# Patient Record
Sex: Male | Born: 1980 | Race: White | Hispanic: No | Marital: Single | Smoking: Current every day smoker
Health system: Southern US, Community
[De-identification: ages and names within clinical notes are randomized; demographics above are authoritative.]

---

## 2000-06-12 ENCOUNTER — Emergency Department (HOSPITAL_COMMUNITY): Admission: EM | Admit: 2000-06-12 | Discharge: 2000-06-12 | Payer: Self-pay | Admitting: Emergency Medicine

## 2000-06-12 ENCOUNTER — Encounter: Payer: Self-pay | Admitting: Emergency Medicine

## 2007-06-07 ENCOUNTER — Emergency Department: Payer: Self-pay | Admitting: Internal Medicine

## 2008-06-04 IMAGING — CR RIGHT FOOT COMPLETE - 3+ VIEW
1 series · 3 of 3 positions shown · non-contrast
Comparison: none

REASON FOR EXAM: Rule out fracture
COMMENTS:

PROCEDURE:     DXR - DXR FOOT RT COMPLETE W/OBLIQUES  - June 07, 2007  [DATE]
RESULT:     There does not appear to be evidence of fracture, dislocation or
malalignment.
Incidental note is made of an os trigonum.

[Series 1: view not recorded · 0.17mm/px · 3 of 3 slices shown]
[im 1/3]
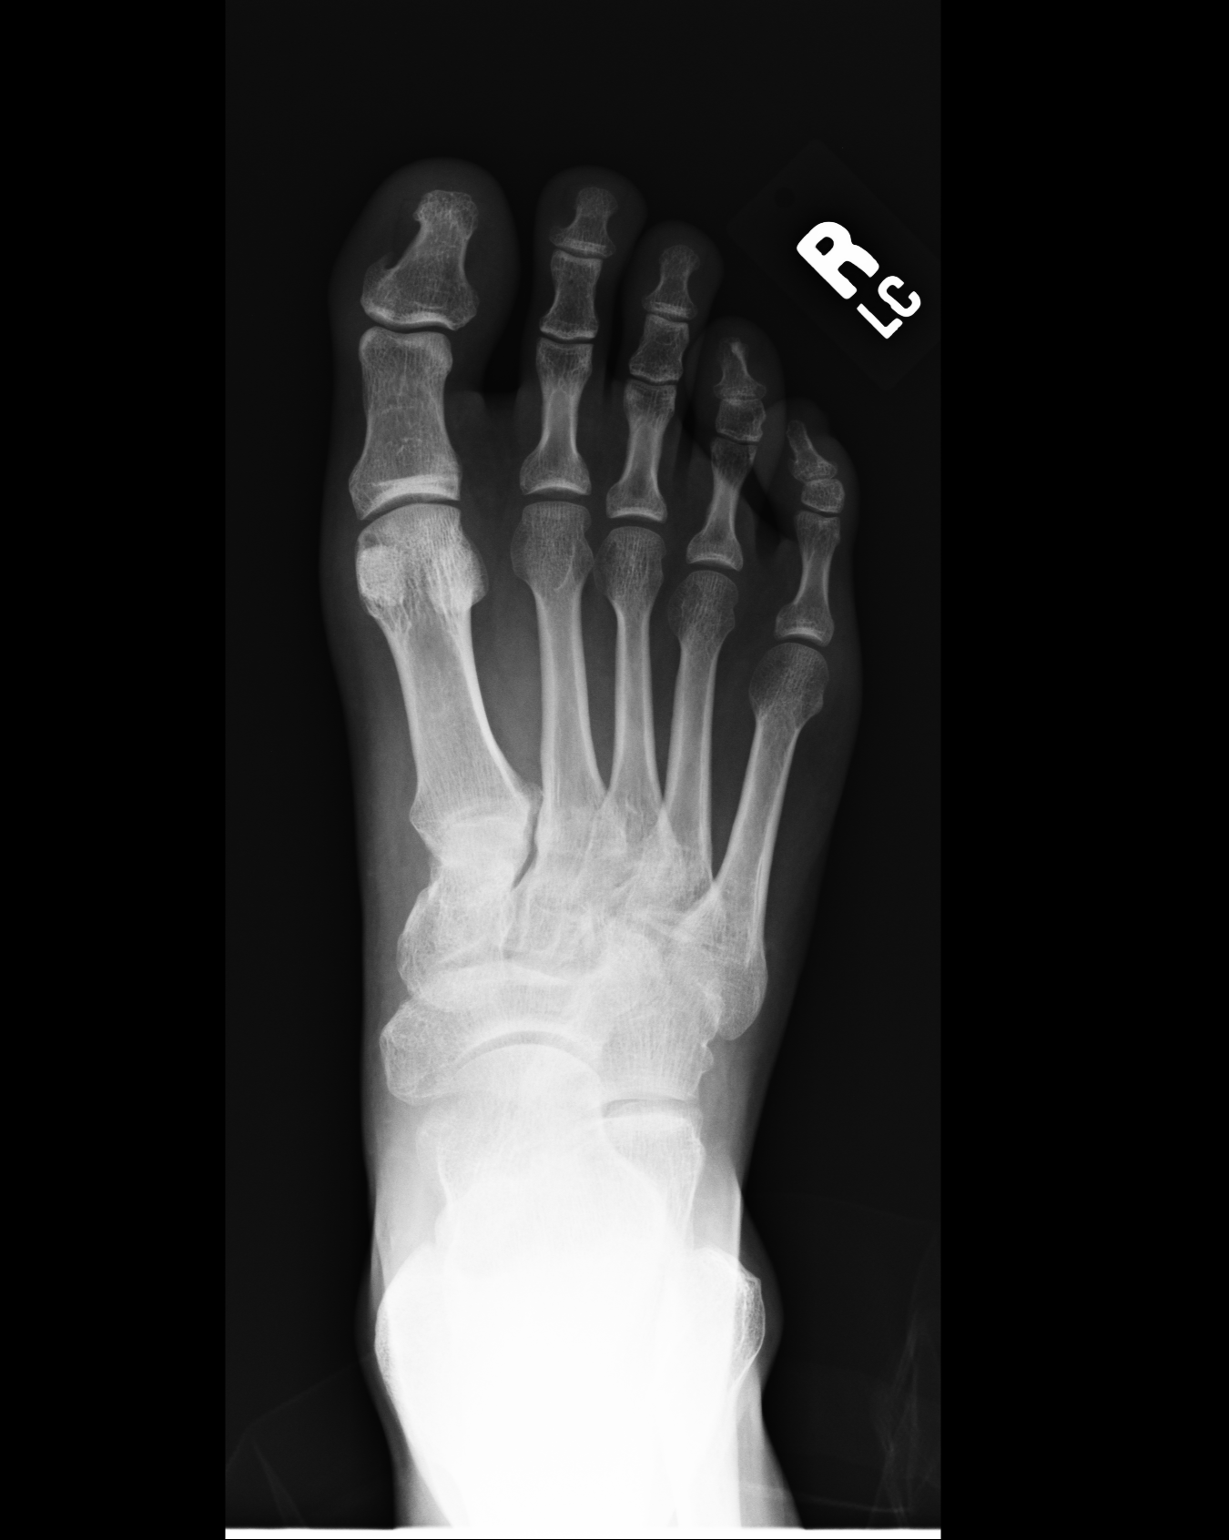
[im 2/3]
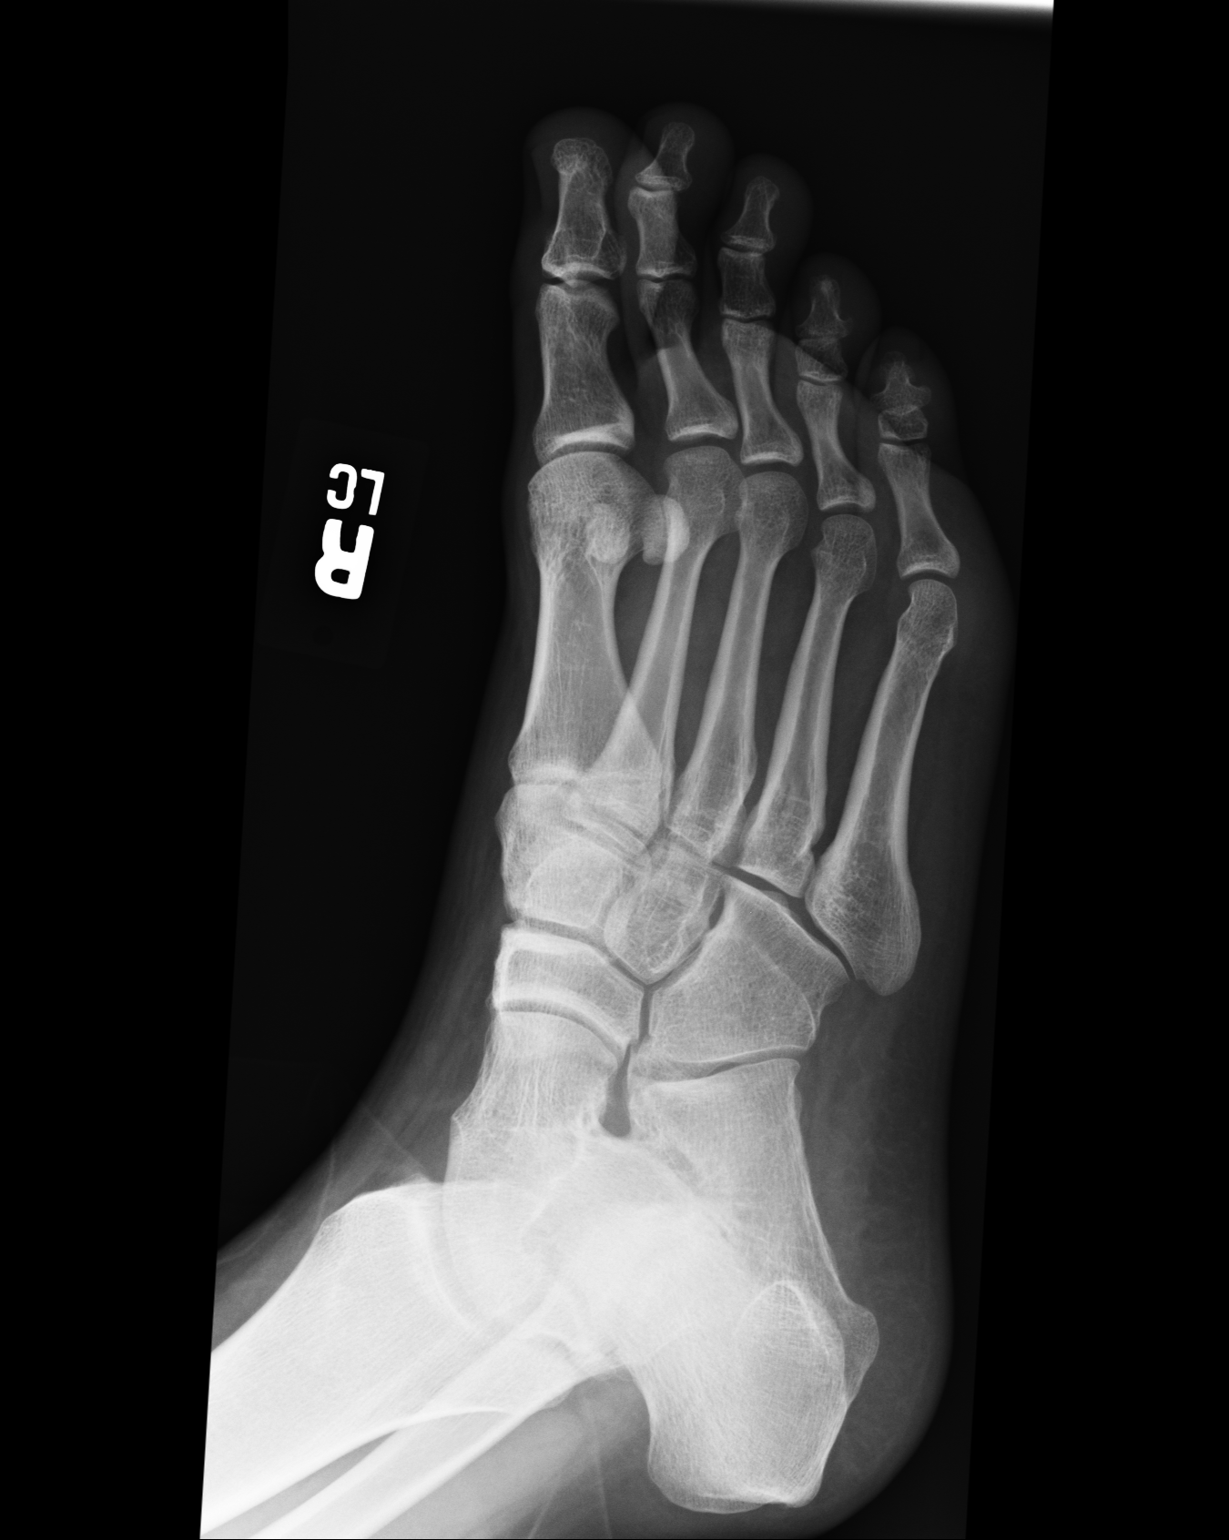
[im 3/3]
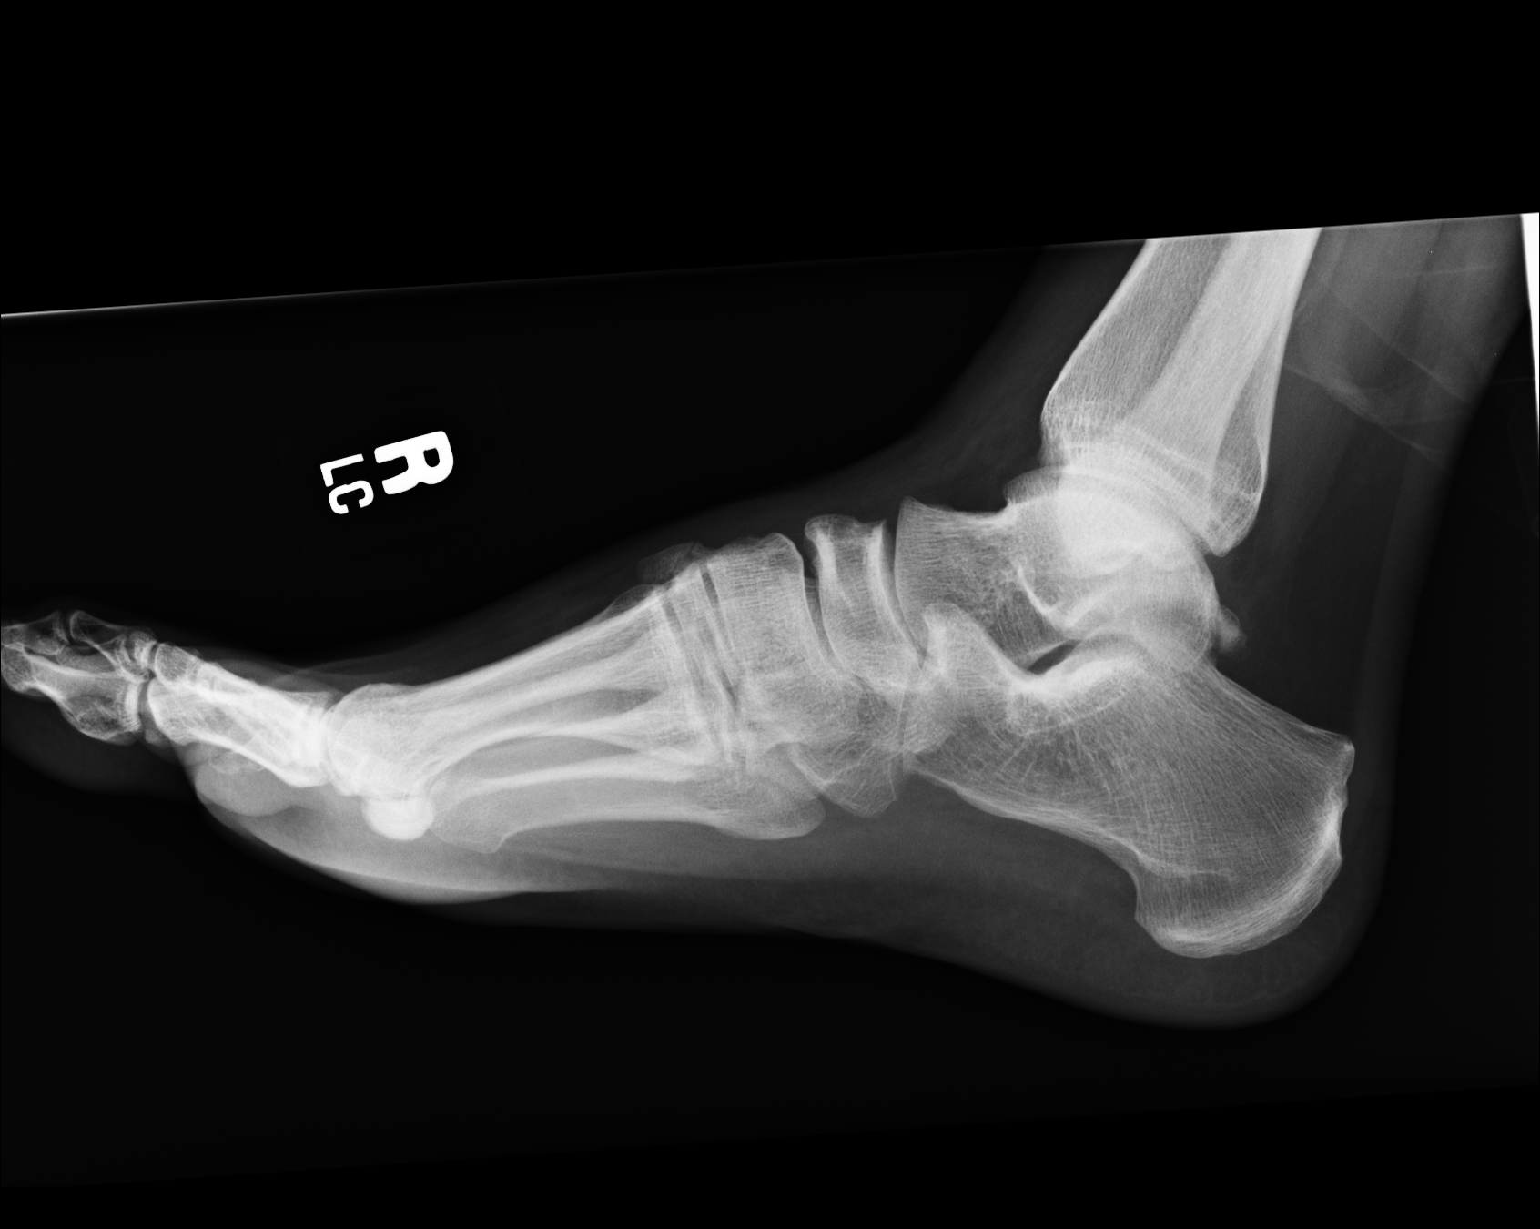

[3 of 3 positions shown; findings below may reference images not displayed]

IMPRESSION: 1.  No evidence of acute abnormality.
2.  If there is persistent clinical concern or persistent complaints of
pain, repeat evaluation in 7-10 days is recommended, if clinically
warranted.

## 2013-08-18 ENCOUNTER — Ambulatory Visit: Payer: Self-pay | Admitting: Family Medicine

## 2016-10-19 ENCOUNTER — Emergency Department (HOSPITAL_COMMUNITY): Payer: Self-pay | Admitting: Anesthesiology

## 2016-10-19 ENCOUNTER — Emergency Department (HOSPITAL_COMMUNITY)
Admission: EM | Admit: 2016-10-19 | Discharge: 2016-10-19 | Disposition: A | Discharge status: Home / Self Care | Payer: Self-pay | Attending: Emergency Medicine | Admitting: Emergency Medicine

## 2016-10-19 ENCOUNTER — Encounter (HOSPITAL_COMMUNITY)
Admission: EM | Disposition: A | Discharge status: Home / Self Care | Payer: Self-pay | Source: Home / Self Care | Attending: Emergency Medicine

## 2016-10-19 DIAGNOSIS — Y9289 Other specified places as the place of occurrence of the external cause: Secondary | ICD-10-CM | POA: Insufficient documentation

## 2016-10-19 DIAGNOSIS — S55102A Unspecified injury of radial artery at forearm level, left arm, initial encounter: Secondary | ICD-10-CM

## 2016-10-19 DIAGNOSIS — Y9389 Activity, other specified: Secondary | ICD-10-CM | POA: Insufficient documentation

## 2016-10-19 DIAGNOSIS — X58XXXA Exposure to other specified factors, initial encounter: Secondary | ICD-10-CM | POA: Insufficient documentation

## 2016-10-19 DIAGNOSIS — S61512A Laceration without foreign body of left wrist, initial encounter: Secondary | ICD-10-CM | POA: Insufficient documentation

## 2016-10-19 DIAGNOSIS — S65112A Laceration of radial artery at wrist and hand level of left arm, initial encounter: Secondary | ICD-10-CM

## 2016-10-19 HISTORY — PX: AV FISTULA PLACEMENT: SHX1204

## 2016-10-19 LAB — I-STAT CHEM 8, ED
BUN: 16 mg/dL (ref 6–20)
Calcium, Ion: 1.13 mmol/L — ABNORMAL LOW (ref 1.15–1.40)
Chloride: 108 mmol/L (ref 101–111)
Creatinine, Ser: 0.8 mg/dL (ref 0.61–1.24)
Glucose, Bld: 127 mg/dL — ABNORMAL HIGH (ref 65–99)
HCT: 39 % (ref 39.0–52.0)
Hemoglobin: 13.3 g/dL (ref 13.0–17.0)
Potassium: 3.6 mmol/L (ref 3.5–5.1)
Sodium: 142 mmol/L (ref 135–145)
TCO2: 24 mmol/L (ref 0–100)

## 2016-10-19 LAB — PROTIME-INR
INR: 0.94
Prothrombin Time: 12.5 seconds (ref 11.4–15.2)

## 2016-10-19 SURGERY — INSERTION OF ARTERIOVENOUS (AV) GORE-TEX GRAFT ARM
Anesthesia: General | Site: Arm Lower | Laterality: Left

## 2016-10-19 MED ORDER — BACITRACIN ZINC 500 UNIT/GM EX OINT
TOPICAL_OINTMENT | CUTANEOUS | Status: AC
  Filled 2016-10-19: qty 28.35

## 2016-10-19 MED ORDER — SUCCINYLCHOLINE CHLORIDE 200 MG/10ML IV SOSY
PREFILLED_SYRINGE | INTRAVENOUS | Status: AC
  Filled 2016-10-19: qty 10

## 2016-10-19 MED ORDER — PROPOFOL 10 MG/ML IV BOLUS
INTRAVENOUS | Status: AC
  Filled 2016-10-19: qty 20

## 2016-10-19 MED ORDER — FENTANYL CITRATE (PF) 250 MCG/5ML IJ SOLN
INTRAMUSCULAR | Status: AC
  Filled 2016-10-19: qty 5

## 2016-10-19 MED ORDER — MIDAZOLAM HCL 2 MG/2ML IJ SOLN
INTRAMUSCULAR | Status: AC
  Filled 2016-10-19: qty 2

## 2016-10-19 MED ORDER — HYDROMORPHONE HCL 1 MG/ML IJ SOLN
INTRAMUSCULAR | Status: AC
  Filled 2016-10-19: qty 1

## 2016-10-19 MED ORDER — MIDAZOLAM HCL 5 MG/5ML IJ SOLN
INTRAMUSCULAR | Status: DC | PRN
  Administered 2016-10-19: 2 mg via INTRAVENOUS

## 2016-10-19 MED ORDER — CEFAZOLIN SODIUM 1 G IJ SOLR
INTRAMUSCULAR | Status: DC | PRN
  Administered 2016-10-19: 2 g via INTRAMUSCULAR

## 2016-10-19 MED ORDER — LACTATED RINGERS IV SOLN
INTRAVENOUS | Status: DC | PRN
  Administered 2016-10-19: 19:00:00 via INTRAVENOUS

## 2016-10-19 MED ORDER — PROPOFOL 10 MG/ML IV BOLUS
INTRAVENOUS | Status: DC | PRN
  Administered 2016-10-19: 200 mg via INTRAVENOUS

## 2016-10-19 MED ORDER — HYDROMORPHONE HCL 1 MG/ML IJ SOLN: 0.2500 mg | INTRAMUSCULAR | Status: DC | PRN

## 2016-10-19 MED ORDER — OXYCODONE-ACETAMINOPHEN 5-325 MG PO TABS: 1.0000 | ORAL_TABLET | Freq: Four times a day (QID) | ORAL | 0 refills | Status: AC | PRN

## 2016-10-19 MED ORDER — PROMETHAZINE HCL 25 MG/ML IJ SOLN: 6.2500 mg | INTRAMUSCULAR | Status: DC | PRN

## 2016-10-19 MED ORDER — TETANUS-DIPHTH-ACELL PERTUSSIS 5-2.5-18.5 LF-MCG/0.5 IM SUSP
0.5000 mL | Freq: Once | INTRAMUSCULAR | Status: AC
  Administered 2016-10-19: 0.5 mL via INTRAMUSCULAR
  Filled 2016-10-19: qty 0.5

## 2016-10-19 MED ORDER — SODIUM CHLORIDE 0.9 % IV SOLN
INTRAVENOUS | Status: DC | PRN
  Administered 2016-10-19: 20:00:00

## 2016-10-19 MED ORDER — HEPARIN SODIUM (PORCINE) 1000 UNIT/ML IJ SOLN
INTRAMUSCULAR | Status: DC | PRN
  Administered 2016-10-19: 3000 [IU] via INTRAVENOUS

## 2016-10-19 MED ORDER — LIDOCAINE 2% (20 MG/ML) 5 ML SYRINGE
INTRAMUSCULAR | Status: AC
  Filled 2016-10-19: qty 5

## 2016-10-19 MED ORDER — ONDANSETRON HCL 4 MG/2ML IJ SOLN
INTRAMUSCULAR | Status: AC
  Filled 2016-10-19: qty 2

## 2016-10-19 MED ORDER — ONDANSETRON HCL 4 MG/2ML IJ SOLN
INTRAMUSCULAR | Status: DC | PRN
  Administered 2016-10-19: 4 mg via INTRAVENOUS

## 2016-10-19 MED ORDER — SUCCINYLCHOLINE CHLORIDE 20 MG/ML IJ SOLN
INTRAMUSCULAR | Status: DC | PRN
  Administered 2016-10-19: 120 mg via INTRAVENOUS

## 2016-10-19 MED ORDER — LIDOCAINE HCL (CARDIAC) 20 MG/ML IV SOLN
INTRAVENOUS | Status: DC | PRN
  Administered 2016-10-19: 100 mg via INTRAVENOUS

## 2016-10-19 MED ORDER — FENTANYL CITRATE (PF) 100 MCG/2ML IJ SOLN
INTRAMUSCULAR | Status: DC | PRN
  Administered 2016-10-19: 100 ug via INTRAVENOUS

## 2016-10-19 MED ORDER — HYDROMORPHONE HCL 1 MG/ML IJ SOLN
1.0000 mg | Freq: Once | INTRAMUSCULAR | Status: AC
Start: 2016-10-19 — End: 2016-10-19
  Administered 2016-10-19: 19:00:00 via INTRAVENOUS

## 2016-10-19 SURGICAL SUPPLY — 33 items
ARMBAND PINK RESTRICT EXTREMIT (MISCELLANEOUS) ×6 IMPLANT
CANISTER SUCT 3000ML PPV (MISCELLANEOUS) ×3 IMPLANT
CATH EMB 2FR 60CM (CATHETERS) ×3 IMPLANT
CLIP TI MEDIUM 6 (CLIP) ×3 IMPLANT
CLIP TI WIDE RED SMALL 6 (CLIP) ×3 IMPLANT
DERMABOND ADVANCED (GAUZE/BANDAGES/DRESSINGS) ×2
DERMABOND ADVANCED .7 DNX12 (GAUZE/BANDAGES/DRESSINGS) ×1 IMPLANT
ELECT REM PT RETURN 9FT ADLT (ELECTROSURGICAL) ×3
ELECTRODE REM PT RTRN 9FT ADLT (ELECTROSURGICAL) ×1 IMPLANT
GAUZE SPONGE 4X4 12PLY STRL (GAUZE/BANDAGES/DRESSINGS) ×3 IMPLANT
GLOVE BIO SURGEON STRL SZ7.5 (GLOVE) ×3 IMPLANT
GLOVE SURG SS PI 7.5 STRL IVOR (GLOVE) ×3 IMPLANT
GOWN STRL REUS W/ TWL LRG LVL3 (GOWN DISPOSABLE) ×2 IMPLANT
GOWN STRL REUS W/ TWL XL LVL3 (GOWN DISPOSABLE) ×1 IMPLANT
GOWN STRL REUS W/TWL LRG LVL3 (GOWN DISPOSABLE) ×4
GOWN STRL REUS W/TWL XL LVL3 (GOWN DISPOSABLE) ×2
INSERT FOGARTY SM (MISCELLANEOUS) ×3 IMPLANT
KIT BASIN OR (CUSTOM PROCEDURE TRAY) ×3 IMPLANT
KIT ROOM TURNOVER OR (KITS) ×3 IMPLANT
NS IRRIG 1000ML POUR BTL (IV SOLUTION) ×3 IMPLANT
PACK CV ACCESS (CUSTOM PROCEDURE TRAY) ×3 IMPLANT
PAD ARMBOARD 7.5X6 YLW CONV (MISCELLANEOUS) ×6 IMPLANT
SUT ETHILON 3 0 PS 1 (SUTURE) ×6 IMPLANT
SUT GORETEX 6.0 TH-9 30 IN (SUTURE) IMPLANT
SUT GORETEX CV-6TTC-13 36IN (SUTURE) IMPLANT
SUT MNCRL AB 4-0 PS2 18 (SUTURE) ×3 IMPLANT
SUT PROLENE 6 0 BV (SUTURE) ×6 IMPLANT
SUT SILK 2 0 SH (SUTURE) IMPLANT
SUT VIC AB 3-0 SH 27 (SUTURE) ×2
SUT VIC AB 3-0 SH 27X BRD (SUTURE) ×1 IMPLANT
TAPE CLOTH SURG 4X10 WHT LF (GAUZE/BANDAGES/DRESSINGS) ×3 IMPLANT
UNDERPAD 30X30 (UNDERPADS AND DIAPERS) ×3 IMPLANT
WATER STERILE IRR 1000ML POUR (IV SOLUTION) ×3 IMPLANT

## 2016-10-19 NOTE — ED Notes (Signed)
MD mesner at bedside called vascular who states to take off tourniquet and hold pressure.

## 2016-10-19 NOTE — Anesthesia Procedure Notes (Signed)
Procedure Name: Intubation Date/Time: 10/19/2016 7:28 PM Performed by: Manuela Schwartz B Pre-anesthesia Checklist: Patient identified, Emergency Drugs available, Suction available, Patient being monitored and Timeout performed Patient Re-evaluated:Patient Re-evaluated prior to inductionOxygen Delivery Method: Circle system utilized Preoxygenation: Pre-oxygenation with 100% oxygen Intubation Type: IV induction and Rapid sequence Laryngoscope Size: Mac and 3 Grade View: Grade I Tube type: Oral Tube size: 7.5 mm Number of attempts: 1 Airway Equipment and Method: Stylet Placement Confirmation: ETT inserted through vocal cords under direct vision,  positive ETCO2 and breath sounds checked- equal and bilateral Secured at: 23 cm Tube secured with: Tape Dental Injury: Teeth and Oropharynx as per pre-operative assessment

## 2016-10-19 NOTE — ED Provider Notes (Signed)
[facility]-EMERGENCY DEPT Provider Note   CSN: [id number] Arrival date & time: 10/19/16  4098     History   Chief Complaint Chief Complaint  Patient presents with  . Laceration    HPI Alexander Livingston is a 36 y.o. male.  HPI  Patient is a 36 year old male with no significant past medical history who presents to the emergency department with arterial injury to his left radial artery. 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 Took approximately 25 minutes before EMS Arrived. When EMS arrived appeared to have an arterial bleed. Tourniquet was applied at 6 PM. Normotensive but tachycardic. Denies anticoagulation or antiplatelets. Denies other trauma or injury.  No past medical history on file.  There are no active problems to display for this patient.   No past surgical history on file.     Home Medications    Prior to Admission medications   Medication Sig Start Date End Date Taking? Authorizing Provider  oxyCODONE-acetaminophen (PERCOCET/ROXICET) 5-325 MG tablet Take 1 tablet by mouth every 6 (six) hours as needed. 10/19/16   Lars Mage, PA-C    Family History No family history on file.  Social History Social History  Substance Use Topics  . Smoking status: Not on file  . Smokeless tobacco: Not on file  . Alcohol use Not on file     Allergies   Patient has no known allergies.   Review of Systems Review of Systems  Constitutional: Positive for diaphoresis.  HENT: Negative for trouble swallowing.   Eyes: Negative for visual disturbance.  Respiratory: Negative for chest tightness and shortness of breath.   Cardiovascular: Negative for chest pain.  Gastrointestinal: Negative for abdominal pain, nausea and vomiting.  Genitourinary: Negative for decreased urine volume.  Musculoskeletal: Negative for neck pain.  Skin: Positive for wound.  Neurological: Positive for numbness. Negative for weakness.    Psychiatric/Behavioral: Negative for behavioral problems.     Physical Exam Updated Vital Signs BP 116/80 (BP Location: Right Arm)   Pulse 93   Temp 97.4 F (36.3 C)   Resp 18   SpO2 95%   Physical Exam  Constitutional: He appears well-developed and well-nourished. He appears distressed.  HENT:  Head: Atraumatic.  Eyes: EOM are normal.  Neck: Neck supple.  Cardiovascular: Intact distal pulses.   Pulmonary/Chest: Effort normal.  Abdominal: He exhibits no distension.  Musculoskeletal: Normal range of motion.  Neurological: He is alert.  Skin:  2 cm laceration to the left wrist, over the radial artery. Pulsatile bleeding. Decreased sensation to the left thumb and index finger. Normal sensation to ulnar distribution. Good cap refill throughout.  Psychiatric: He has a normal mood and affect.     ED Treatments / Results  Labs (all labs ordered are listed, but only abnormal results are displayed) Labs Reviewed  I-STAT CHEM 8, ED - Abnormal; Notable for the following:       Result Value   Glucose, Bld 127 (*)    Calcium, Ion 1.13 (*)    All other components within normal limits  PROTIME-INR    EKG  EKG Interpretation None       Radiology No results found.  Procedures Procedures (including critical care time)  Medications Ordered in ED Medications  HYDROmorphone (DILAUDID) injection 1 mg ( Intravenous Given 10/19/16 1900)  Tdap (BOOSTRIX) injection 0.5 mL (0.5 mLs Intramuscular Given 10/19/16 2038)     Initial Impression / Assessment and  Plan / ED Course  I have reviewed the triage vital signs and the nursing notes.  Pertinent labs & imaging results that were available during my care of the patient were reviewed by me and considered in my medical decision making (see chart for details).     Patient with a left arterial bleed after [facility] Tourniquet 6 PM. On arrival tourniquet was taken down and pressure dressing was  applied. Obvious arterial injury. No injury to ulnar artery. Good cap refill, diminished sensation to some and index finger. Tetanus is up-to-date. Vascular surgery was consult., Evaluated the patient in the emergency department, taken emergently to the OR.  Final Clinical Impressions(s) / ED Diagnoses   Final diagnoses:  Injury of left radial artery, initial encounter    New Prescriptions Discharge Medication List as of 10/19/2016  8:32 PM    START taking these medications   Details  oxyCODONE-acetaminophen (PERCOCET/ROXICET) 5-325 MG tablet Take 1 tablet by mouth every 6 (six) hours as needed., Starting Mon 10/19/2016, Print         Corena HerterMumma, Joi Leyva, MD 10/20/16 0011    Marily MemosMesner, Jason, MD 10/21/16 1610

## 2016-10-19 NOTE — Transfer of Care (Signed)
Immediate Anesthesia Transfer of Care Note  Patient: Alexander Livingston  Procedure(s) Performed: Procedure(s): Repair of left Radial Artery. (Left)  Patient Location: PACU  Anesthesia Type:General  Level of Consciousness: awake, alert  and oriented  Airway & Oxygen Therapy: Patient Spontanous Breathing  Post-op Assessment: Report given to RN and Post -op Vital signs reviewed and stable  Post vital signs: Reviewed and stable  Last Vitals:  Vitals:   10/19/16 1859 10/19/16 2027  BP:  (!) 125/92  Pulse: 62 (!) 102  Resp: 20 (P) 17  Temp:  (P) 36.3 C    Last Pain:  Vitals:   10/19/16 1853  PainSc: 10-Worst pain ever         Complications: No apparent anesthesia complications

## 2016-10-19 NOTE — Consult Note (Signed)
  Hospital Consult    Reason for Consult:  Bleeding left wrist Requesting Physician:  ED MRN #:  [id number]  History of Present Illness: This is a 36 y.o. male here with bleeding left wrist. [facility]ust[facility]. Says tetanus up to date. Last meal this a.m.   No past medical history on file.  No past surgical history on file.  No Known Allergies  Prior to Admission medications   Not on File    Social History   Social History  . Marital status: Single    Spouse name: N/A  . Number of children: N/A  . Years of education: N/A   Occupational History  . Not on file.   Social History Main Topics  . Smoking status: Not on file  . Smokeless tobacco: Not on file  . Alcohol use Not on file  . Drug use: Unknown  . Sexual activity: Not on file   Other Topics Concern  . Not on file   Social History Narrative  . No narrative on file    No family history on file.  ROS: negative except above  Physical Examination  Vitals:   10/19/16 1852 10/19/16 1859  BP: 113/85   Pulse: 60 62  Resp:  20   There is no height or weight on file to calculate BMI.  General:  WDWN in NAD Gait: Not observed HENT: WNL, normocephalic Pulmonary: normal non-labored breathing, without Rales, rhonchi,  wheezing Cardiac: mild tach Abdomen: soft Extremities:bleeding evident from left wrist Neurologic: gross motor in tact left hand Psychiatric:  Appropriate mood and affect  CBC No results found for: WBC, RBC, HGB, HCT, PLT, MCV, MCH, MCHC, RDW, LYMPHSABS, MONOABS, EOSABS, BASOSABS  BMET No results found for: NA, K, CL, CO2, GLUCOSE, BUN, CREATININE, CALCIUM, GFRNONAA, GFRAA  COAGS: No results found for: INR, PROTIME   ASSESSMENT/PLAN: This is a [age] y.o. male with bleeding left wrist. Will take urgently to OR for exploration.   Demetris Meinhardt C. Randie Heinzain, MD [facility] [place] Office: [phone] Pager: [phone]

## 2016-10-19 NOTE — Op Note (Signed)
    Patient name: Alexander Livingston MRN: [id number] DOB: January 15, 1981 Sex: male  10/19/2016 Pre-operative Diagnosis: laceration of left wrist Post-operative diagnosis:  Same Surgeon:  Luanna SalkBrandon C. Randie Heinzain, MD Assistant: Lianne CureMaureen Collins, PA Procedure Performed: Primary repair of left radial artery  Indications:  36 year old male status post traumatic laceration to left wrist with extensive blood loss seen and in the emergency room. He is therefore indicated for exposure left wrist possible arterial repair.  Findings: Left radial artery was 50% transected. There is an intact ulnar artery and arch by Doppler. The radial artery was primarily repaired and at completion there was a palpable pulse distal to the repair as well as a strong signal.   Procedure:  The patient was identified in the holding area and taken to the operating room where he was placed supine on the operating table general endotracheal anesthesia was induced. During this time direct pressure was being held over the left wrist laceration site. A timeout was called and he was given ancef. He was then given 3000 of heparin and Esmarch was used to exsanguinate the arm and tourniquet inflated to 250 mmHg above the elbow. The left hand and arm was then sterilely prepped and draped quickly. We then evaluated the wound identify the radial artery and were able to put clamps proximally and distally and the tourniquet was allowed down after 9 minutes of inflation time. I then allowed antegrade and retrograde bleeding was minimal. A 2 Fogarty was used to thrombectomize the radial artery until there was strong antegrade and retrograde bleeding. There was a strong ulnar signal by Doppler as well as palmar arch. Given that I repaired the radial artery with a 6-0 Prolene suture. At completion there was a signal distal to the radial artery was also palpable. The remaining strong signal in the ulnar artery as well as the arch. We irrigated the wound copiously and  then closed with 3-0 nylon suture. Patient tolerated procedure well without immediate complication. All counts were correct at completion.     Aniylah Avans C. Randie Heinzain, MD [facility] [place] Office: [phone] Pager: [phone]

## 2016-10-19 NOTE — Anesthesia Preprocedure Evaluation (Addendum)
Anesthesia Evaluation  Patient identified by MRN, date of birth, ID band Patient awake    Reviewed: Allergy & Precautions, NPO status Preop documentation limited or incomplete due to emergent nature of procedure.  Airway Mallampati: II  TM Distance: >3 FB Neck ROM: Full    Dental  (+) Dental Advisory Given   Pulmonary    breath sounds clear to auscultation       Cardiovascular  Rhythm:Regular Rate:Normal  Radial artery injury   Neuro/Psych    GI/Hepatic   Endo/Other    Renal/GU      Musculoskeletal   Abdominal   Peds  Hematology   Anesthesia Other Findings   Reproductive/Obstetrics                             Anesthesia Physical Anesthesia Plan  ASA: III and emergent  Anesthesia Plan: General   Post-op Pain Management:    Induction: Intravenous  PONV Risk Score and Plan: 2 and Ondansetron and Dexamethasone  Airway Management Planned: Oral ETT  Additional Equipment:   Intra-op Plan:   Post-operative Plan: Extubation in OR  Informed Consent: I have reviewed the patients History and Physical, chart, labs and discussed the procedure including the risks, benefits and alternatives for the proposed anesthesia with the patient or authorized representative who has indicated his/her understanding and acceptance.   Dental advisory given  Plan Discussed with: CRNA  Anesthesia Plan Comments:        Anesthesia Quick Evaluation

## 2016-10-19 NOTE — ED Triage Notes (Addendum)
Pt arrives to ED from [place] ems with laceration to left wrist with tourniquet applied.Pt hit his left wrist on a AC unit door.  Pt has what appears to pt arterial bleed.

## 2016-10-19 NOTE — Anesthesia Postprocedure Evaluation (Signed)
Anesthesia Post Note  Patient: Alexander Livingston  Procedure(s) Performed: Procedure(s) (LRB): Repair of left Radial Artery. (Left)     Patient location during evaluation: PACU Anesthesia Type: General Level of consciousness: awake and alert Pain management: pain level controlled Vital Signs Assessment: post-procedure vital signs reviewed and stable Respiratory status: spontaneous breathing, nonlabored ventilation, respiratory function stable and patient connected to nasal cannula oxygen Cardiovascular status: blood pressure returned to baseline and stable Postop Assessment: no signs of nausea or vomiting Anesthetic complications: no    Last Vitals:  Vitals:   10/19/16 2027 10/19/16 2040  BP: (!) 125/92 116/80  Pulse: 99 93  Resp: 17 18  Temp: 36.3 C 36.3 C    Last Pain:  Vitals:   10/19/16 2040  PainSc: 0-No pain                 Kennieth RadFitzgerald, Trygve Thal E

## 2016-10-20 ENCOUNTER — Encounter (HOSPITAL_COMMUNITY): Payer: Self-pay | Admitting: Vascular Surgery

## 2016-10-20 ENCOUNTER — Telehealth: Payer: Self-pay | Admitting: Vascular Surgery

## 2016-10-20 LAB — POCT I-STAT 4, (NA,K, GLUC, HGB,HCT)
Glucose, Bld: 121 mg/dL — ABNORMAL HIGH (ref 65–99)
HCT: 36 % — ABNORMAL LOW (ref 39.0–52.0)
HEMOGLOBIN: 12.2 g/dL — AB (ref 13.0–17.0)
POTASSIUM: 3.4 mmol/L — AB (ref 3.5–5.1)
SODIUM: 143 mmol/L (ref 135–145)

## 2016-10-20 NOTE — Telephone Encounter (Signed)
-----   Message from Sharee PimpleMarilyn K McChesney, RN sent at 10/19/2016 10:45 PM EDT ----- Regarding: 2 weeks w/ Randie Heinzain   ----- Message ----- From: Lars Mageollins, Emma M, PA-C Sent: 10/19/2016   8:29 PM To: [facility] Charge Pool  F/U with Dr. Randie Heinzain in 2 weeks s/p left radial artery injury.

## 2016-10-20 NOTE — Telephone Encounter (Signed)
Sched appt 11/06/16 at 8:30. Spoke to pt to confirm appt.

## 2016-10-22 ENCOUNTER — Encounter: Payer: Self-pay | Admitting: Vascular Surgery

## 2016-11-02 ENCOUNTER — Encounter: Payer: Self-pay | Admitting: Vascular Surgery

## 2016-11-06 ENCOUNTER — Ambulatory Visit: Payer: Self-pay | Admitting: Vascular Surgery

## 2016-11-06 ENCOUNTER — Ambulatory Visit (INDEPENDENT_AMBULATORY_CARE_PROVIDER_SITE_OTHER): Payer: Self-pay | Admitting: Vascular Surgery

## 2016-11-06 ENCOUNTER — Encounter: Payer: Self-pay | Admitting: Vascular Surgery

## 2016-11-06 VITALS — BP 124/81 | HR 68 | Temp 98.7°F | Resp 20 | Ht 72.0 in | Wt 214.0 lb

## 2016-11-06 DIAGNOSIS — S55102D Unspecified injury of radial artery at forearm level, left arm, subsequent encounter: Secondary | ICD-10-CM

## 2016-11-06 NOTE — Progress Notes (Signed)
Subjective:     Patient ID: Alexander Livingston, male   DOB: April 24, 1981, 36 y.o.   MRN: [id number]  HPI 36 year old male returns from recent repair of left radial artery following trauma. He is doing well with minimal burning at his thenar eminence. His hand is a normal use. His only complaint is some visual changes that is unsure when they started. He is otherwise back to work with no other complaints today.   Review of Systems Burning near left thumb Visual changes    Objective:   Physical Exam [id number] Multiphasic left ulnar artery signal and palmar arch that fades with compression of ulnar artery Left grip strength is 5/5 Incision is well healed with sutures    Assessment/plan     36yo male returns after having attempted repair of the left radial artery with trauma. Unfortunately it appears the left radial artery has thrombosed his oral artery is dominant and filling his palmar arch is hand is warm and strength is intact. Sutures were removed in the office today and he can follow-up on a when necessary basis.  Brandon C. Randie Heinzain, MD [facility] [place] Office: [phone] Pager: [phone]
